# Patient Record
Sex: Male | Born: 1960 | Race: Black or African American | Hispanic: No | Marital: Married | Smoking: Current every day smoker
Health system: Southern US, Community
[De-identification: ages and names within clinical notes are randomized; demographics above are authoritative.]

## PROBLEM LIST (undated history)

## (undated) DIAGNOSIS — Z789 Other specified health status: Secondary | ICD-10-CM

---

## 2004-02-06 ENCOUNTER — Other Ambulatory Visit: Payer: Self-pay

## 2009-09-27 ENCOUNTER — Emergency Department: Payer: Self-pay | Admitting: Emergency Medicine

## 2009-10-20 ENCOUNTER — Ambulatory Visit: Payer: Self-pay

## 2011-06-21 DEATH — deceased

## 2014-01-28 ENCOUNTER — Other Ambulatory Visit: Payer: Self-pay

## 2014-01-28 ENCOUNTER — Telehealth: Payer: Self-pay

## 2014-01-28 DIAGNOSIS — Z1211 Encounter for screening for malignant neoplasm of colon: Secondary | ICD-10-CM

## 2014-01-28 NOTE — Telephone Encounter (Signed)
Gastroenterology Pre-Procedure Review  Request Date: 02/14/2014 Requesting Physician: Dr. Felecia ShellingFanta  PATIENT REVIEW QUESTIONS: The patient responded to the following health history questions as indicated:    1. Diabetes Melitis: no 2. Joint replacements in the past 12 months: no 3. Major health problems in the past 3 months: no 4. Has an artificial valve or MVP: no 5. Has a defibrillator: no 6. Has been advised in past to take antibiotics in advance of a procedure like teeth cleaning: no    MEDICATIONS & ALLERGIES:    Patient reports the following regarding taking any blood thinners:   Plavix? no Aspirin? no Coumadin? no  Patient confirms/reports the following medications:  No current outpatient prescriptions on file.   No current facility-administered medications for this visit.    Patient confirms/reports the following allergies:  No Known Allergies  No orders of the defined types were placed in this encounter.    AUTHORIZATION INFORMATION Primary Insurance:   ID #:  Group #:  Pre-Cert / Auth required:  Pre-Cert / Auth #:   Secondary Insurance:   ID #:   Group #:  Pre-Cert / Auth required: Pre-Cert / Auth #:   SCHEDULE INFORMATION: Procedure has been scheduled as follows:  Date:  02/14/2014                Time: 1:00 PM  Location: [facility] Short Stay   This Gastroenterology Pre-Precedure Review Form is being routed to the following provider(s): R. Roetta SessionsMichael Rourk, MD

## 2014-01-28 NOTE — Telephone Encounter (Signed)
Pt is calling because he received a letter to set up a TCS.

## 2014-01-29 NOTE — Telephone Encounter (Signed)
Appropriate.

## 2014-02-04 MED ORDER — PEG-KCL-NACL-NASULF-NA ASC-C 100 G PO SOLR: 1.0000 | ORAL | Status: DC

## 2014-02-04 NOTE — Telephone Encounter (Signed)
Rx sent to the pharmacy and instructions mailed to pt.  

## 2014-02-07 ENCOUNTER — Telehealth: Payer: Self-pay

## 2014-02-07 NOTE — Telephone Encounter (Signed)
Per Durward Mallardamille, she spoke to [place] and [place] is not [place] for screening colonoscopy. It will be covered at 100%.  #[id number]

## 2014-02-08 ENCOUNTER — Telehealth: Payer: Self-pay

## 2014-02-08 MED ORDER — PEG-KCL-NACL-NASULF-NA ASC-C 100 G PO SOLR: 1.0000 | ORAL | Status: DC

## 2014-02-08 NOTE — Telephone Encounter (Signed)
I tried to call it in again and the transmission failed again.  I called the Movie prep to Snje[place] at [facility].   I called pt and LMOM that prescription has been sent in and to call if he has questions.

## 2014-02-08 NOTE — Telephone Encounter (Signed)
Pt left VM that his prescription for the prep was not at the pharmacy. It does like like the transmission failed. I will resend.

## 2014-02-12 ENCOUNTER — Telehealth: Payer: Self-pay

## 2014-02-12 MED ORDER — PEG 3350-KCL-NA BICARB-NACL 420 G PO SOLR: 4000.0000 mL | ORAL | Status: DC

## 2014-02-12 NOTE — Telephone Encounter (Signed)
Pt called and is scheduled for his colonoscopy on 02/14/2014. He cannot afford the Movie prep. I told him I will send in the Trilyte prep and fax his instructions to the pharmacy to pick up there.  He will call if any problems and he is aware to start his clear liquids today at 2:00 pm.

## 2014-02-12 NOTE — Telephone Encounter (Signed)
Verbal provided by nurse.

## 2014-02-12 NOTE — Telephone Encounter (Signed)
I called phamacy and the transmission had failed for the Trilyte prep. I spoke to [place] who took the verbal order and she had received the instructions for the pt.

## 2014-02-13 ENCOUNTER — Telehealth: Payer: Self-pay | Admitting: *Deleted

## 2014-02-13 NOTE — Telephone Encounter (Signed)
Returned call

## 2014-02-13 NOTE — Telephone Encounter (Signed)
Pt's wife called wanting to speak with [place]. Please advise [phone], wife's name is [employer].

## 2014-02-14 ENCOUNTER — Encounter (HOSPITAL_COMMUNITY)
Admission: RE | Disposition: A | Discharge status: Home / Self Care | Payer: Self-pay | Source: Ambulatory Visit | Attending: Internal Medicine

## 2014-02-14 ENCOUNTER — Ambulatory Visit (HOSPITAL_COMMUNITY)
Admission: RE | Admit: 2014-02-14 | Discharge: 2014-02-14 | Disposition: A | Discharge status: Home / Self Care | Payer: 59 | Source: Ambulatory Visit | Attending: Internal Medicine | Admitting: Internal Medicine

## 2014-02-14 ENCOUNTER — Encounter (HOSPITAL_COMMUNITY): Payer: Self-pay

## 2014-02-14 DIAGNOSIS — K5730 Diverticulosis of large intestine without perforation or abscess without bleeding: Secondary | ICD-10-CM | POA: Insufficient documentation

## 2014-02-14 DIAGNOSIS — F17200 Nicotine dependence, unspecified, uncomplicated: Secondary | ICD-10-CM | POA: Insufficient documentation

## 2014-02-14 DIAGNOSIS — Z1211 Encounter for screening for malignant neoplasm of colon: Secondary | ICD-10-CM | POA: Insufficient documentation

## 2014-02-14 HISTORY — DX: Other specified health status: Z78.9

## 2014-02-14 HISTORY — PX: COLONOSCOPY: SHX5424

## 2014-02-14 SURGERY — COLONOSCOPY
Anesthesia: Moderate Sedation

## 2014-02-14 MED ORDER — MEPERIDINE HCL 100 MG/ML IJ SOLN
INTRAMUSCULAR | Status: AC
  Filled 2014-02-14: qty 2

## 2014-02-14 MED ORDER — STERILE WATER FOR IRRIGATION IR SOLN
Status: DC | PRN
  Administered 2014-02-14: 12:00:00

## 2014-02-14 MED ORDER — MIDAZOLAM HCL 5 MG/5ML IJ SOLN
INTRAMUSCULAR | Status: DC | PRN
  Administered 2014-02-14 (×2): 2 mg via INTRAVENOUS
  Administered 2014-02-14: 1 mg via INTRAVENOUS

## 2014-02-14 MED ORDER — ONDANSETRON HCL 4 MG/2ML IJ SOLN
INTRAMUSCULAR | Status: DC | PRN
  Administered 2014-02-14: 4 mg via INTRAVENOUS

## 2014-02-14 MED ORDER — MEPERIDINE HCL 100 MG/ML IJ SOLN
INTRAMUSCULAR | Status: DC | PRN
  Administered 2014-02-14 (×2): 50 mg via INTRAVENOUS

## 2014-02-14 MED ORDER — ONDANSETRON HCL 4 MG/2ML IJ SOLN
INTRAMUSCULAR | Status: AC
  Filled 2014-02-14: qty 2

## 2014-02-14 MED ORDER — SODIUM CHLORIDE 0.9 % IV SOLN
INTRAVENOUS | Status: DC
Start: 2014-02-14 — End: 2014-02-14

## 2014-02-14 MED ORDER — MIDAZOLAM HCL 5 MG/5ML IJ SOLN
INTRAMUSCULAR | Status: AC
  Filled 2014-02-14: qty 10

## 2014-02-14 NOTE — Op Note (Signed)
[facility] [address] [place], [address]   COLONOSCOPY PROCEDURE REPORT  PATIENT: Nathan Reed, Nathan Reed  MR#:         [id number] BIRTHDATE: 05/10/1961 , 53  yrs. old GENDER: Male ENDOSCOPIST: R.  Roetta Sessions, MD FACP FACG REFERRED BY:  Glenice Laine, M.D. PROCEDURE DATE:  02/14/2014 PROCEDURE:     Screening colonoscopy  INDICATIONS: First-ever average risk colorectal cancer screening examination  INFORMED CONSENT:  The risks, benefits, alternatives and imponderables including but not limited to bleeding, perforation as well as the possibility of a missed lesion have been reviewed.  The potential for biopsy, lesion removal, etc. have also been discussed.  Questions have been answered.  All parties agreeable. Please see the history and physical in the medical record for more information.  MEDICATIONS: Versed 5 mg IV and Demerol 100 mg IV in divided doses.  DESCRIPTION OF PROCEDURE:  After a digital rectal exam was performed, the EC-3890Li ([id number])  colonoscope was advanced from the anus through the rectum and colon to the area of the cecum, ileocecal valve and appendiceal orifice.  The cecum was deeply intubated.  These structures were well-seen and photographed for the record.  From the level of the cecum and ileocecal valve, the scope was slowly and cautiously withdrawn.  The mucosal surfaces were carefully surveyed utilizing scope tip deflection to facilitate fold flattening as needed.  The scope was pulled down into the rectum where a thorough examination including retroflexion was performed.    FINDINGS:  Adequate preparation. Normal rectum. Few scattered left-sided diverticula; the remainder of the colonic mucosa appeared normal.  THERAPEUTIC / DIAGNOSTIC MANEUVERS PERFORMED:  None  COMPLICATIONS: None  CECAL WITHDRAWAL TIME:  8 minutes  IMPRESSION:  Colonic diverticulosis (mild)  RECOMMENDATIONS: Repeat colonoscopy in 10 years for  screening purposes.   _______________________________ eSigned:  R. Roetta Sessions, MD FACP [facility] 02/14/2014 12:32 PM   CC:

## 2014-02-14 NOTE — H&P (Signed)
@  QZES@   Primary Care Physician:  Rosita Fire, MD Primary Gastroenterologist:  Dr. Gala Romney  Pre-Procedure History & Physical: HPI:  Nathan Reed is a 53 y.o. male is here for a screening colonoscopy. No bowel symptoms. No family history of colon cancer. No prior colonoscopy.  Past Medical History  Diagnosis Date  . Medical history non-contributory     History reviewed. No pertinent past surgical history.  Prior to Admission medications   Medication Sig Start Date End Date Taking? Authorizing Provider  peg 3350 powder (MOVIPREP) 100 G SOLR Take 1 kit (200 g total) by mouth as directed. 02/08/14  Yes Daneil Dolin, MD  polyethylene glycol-electrolytes (TRILYTE) 420 G solution Take 4,000 mLs by mouth as directed. 02/12/14  Yes Daneil Dolin, MD    Allergies as of 01/28/2014  . (No Known Allergies)    History reviewed. No pertinent family history.  History   Social History  . Marital Status: Married    Spouse Name: N/A    Number of Children: N/A  . Years of Education: N/A   Occupational History  . Not on file.   Social History Main Topics  . Smoking status: Current Every Day Smoker  . Smokeless tobacco: Not on file  . Alcohol Use: 0.6 oz/week    1 Cans of beer per week  . Drug Use: No  . Sexual Activity: Not on file   Other Topics Concern  . Not on file   Social History Narrative  . No narrative on file    Review of Systems: See HPI, otherwise negative ROS  Physical Exam: BP 141/89  Temp(Src) 98.2 F (36.8 C) (Oral)  Resp 16  Ht $R'5\' 7"'wV$  (1.702 m)  Wt 160 lb (72.576 kg)  BMI 25.05 kg/m2  SpO2 98% General:   Alert,  Well-developed, well-nourished, pleasant and cooperative in NAD Head:  Normocephalic and atraumatic. Eyes:  Sclera clear, no icterus.   Conjunctiva pink. Ears:  Normal auditory acuity. Nose:  No deformity, discharge,  or lesions. Mouth:  No deformity or lesions, dentition normal. Neck:  Supple; no masses or thyromegaly. Lungs:  Clear  throughout to auscultation.   No wheezes, crackles, or rhonchi. No acute distress. Heart:  Regular rate and rhythm; no murmurs, clicks, rubs,  or gallops. Abdomen:  Soft, nontender and nondistended. No masses, hepatosplenomegaly or hernias noted. Normal bowel sounds, without guarding, and without rebound.   Msk:  Symmetrical without gross deformities. Normal posture. Pulses:  Normal pulses noted. Extremities:  Without clubbing or edema. Neurologic:  Alert and  oriented x4;  grossly normal neurologically. Skin:  Intact without significant lesions or rashes. Cervical Nodes:  No significant cervical adenopathy. Psych:  Alert and cooperative. Normal mood and affect.  Impression/Plan: Nathan Reed is now here to undergo a screening colonoscopy.  First-ever average risk screening examination.  Risks, benefits, limitations, imponderables and alternatives regarding colonoscopy have been reviewed with the patient. Questions have been answered. All parties agreeable.     Notice:  This dictation was prepared with Dragon dictation along with smaller phrase technology. Any transcriptional errors that result from this process are unintentional and may not be corrected upon review.

## 2014-02-14 NOTE — Discharge Instructions (Addendum)
Colonoscopy Discharge Instructions  Read the instructions outlined below and refer to this sheet in the next few weeks. These discharge instructions provide you with general information on caring for yourself after you leave the hospital. Your doctor may also give you specific instructions. While your treatment has been planned according to the most current medical practices available, unavoidable complications occasionally occur. If you have any problems or questions after discharge, call Dr. Jena Gaussourk at [phone]. ACTIVITY  You may resume your regular activity, but move at a slower pace for the next 24 hours.   Take frequent rest periods for the next 24 hours.   Walking will help get rid of the air and reduce the bloated feeling in your belly (abdomen).   No driving for 24 hours (because of the medicine (anesthesia) used during the test).    Do not sign any important legal documents or operate any machinery for 24 hours (because of the anesthesia used during the test).  NUTRITION  Drink plenty of fluids.   You may resume your normal diet as instructed by your doctor.   Begin with a light meal and progress to your normal diet. Heavy or fried foods are harder to digest and may make you feel sick to your stomach (nauseated).   Avoid alcoholic beverages for 24 hours or as instructed.  MEDICATIONS  You may resume your normal medications unless your doctor tells you otherwise.  WHAT YOU CAN EXPECT TODAY  Some feelings of bloating in the abdomen.   Passage of more gas than usual.   Spotting of blood in your stool or on the toilet paper.  IF YOU HAD POLYPS REMOVED DURING THE COLONOSCOPY:  No aspirin products for 7 days or as instructed.   No alcohol for 7 days or as instructed.   Eat a soft diet for the next 24 hours.  FINDING OUT THE RESULTS OF YOUR TEST Not all test results are available during your visit. If your test results are not back during the visit, make an appointment  with your caregiver to find out the results. Do not assume everything is normal if you have not heard from your caregiver or the medical facility. It is important for you to follow up on all of your test results.  SEEK IMMEDIATE MEDICAL ATTENTION IF:  You have more than a spotting of blood in your stool.   Your belly is swollen (abdominal distention).   You are nauseated or vomiting.   You have a temperature over 101.   You have abdominal pain or discomfort that is severe or gets worse throughout the day.     Diverticulosis information provided  Repeat colonoscopy in 10 years for screening purposes  High-Fiber Diet Fiber is found in fruits, vegetables, and grains. A high-fiber diet encourages the addition of more whole grains, legumes, fruits, and vegetables in your diet. The recommended amount of fiber for adult males is 38 g per day. For adult females, it is 25 g per day. Pregnant and lactating women should get 28 g of fiber per day. If you have a digestive or bowel problem, ask your caregiver for advice before adding high-fiber foods to your diet. Eat a variety of high-fiber foods instead of only a select few type of foods.  PURPOSE  To increase stool bulk.  To make bowel movements more regular to prevent constipation.  To lower cholesterol.  To prevent overeating. WHEN IS THIS DIET USED?  It may be used if you have constipation and  hemorrhoids.  It may be used if you have uncomplicated diverticulosis (intestine condition) and irritable bowel syndrome.  It may be used if you need help with weight management.  It may be used if you want to add it to your diet as a protective measure against atherosclerosis, diabetes, and cancer. SOURCES OF FIBER  Whole-grain breads and cereals.  Fruits, such as apples, oranges, bananas, berries, prunes, and pears.  Vegetables, such as green peas, carrots, sweet potatoes, beets, broccoli, cabbage, spinach, and artichokes.  Legumes,  such split peas, soy, lentils.  Almonds. FIBER CONTENT IN FOODS Starches and Grains / Dietary Fiber (g)  Cheerios, 1 cup / 3 g  Corn Flakes cereal, 1 cup / 0.7 g  Rice crispy treat cereal, 1 cup / 0.3 g  Instant oatmeal (cooked),  cup / 2 g  Frosted wheat cereal, 1 cup / 5.1 g  Brown, long-grain rice (cooked), 1 cup / 3.5 g  White, long-grain rice (cooked), 1 cup / 0.6 g  Enriched macaroni (cooked), 1 cup / 2.5 g Legumes / Dietary Fiber (g)  Baked beans (canned, plain, or vegetarian),  cup / 5.2 g  Kidney beans (canned),  cup / 6.8 g  Pinto beans (cooked),  cup / 5.5 g Breads and Crackers / Dietary Fiber (g)  Plain or honey graham crackers, 2 squares / 0.7 g  Saltine crackers, 3 squares / 0.3 g  Plain, salted pretzels, 10 pieces / 1.8 g  Whole-wheat bread, 1 slice / 1.9 g  White bread, 1 slice / 0.7 g  Raisin bread, 1 slice / 1.2 g  Plain bagel, 3 oz / 2 g  Flour tortilla, 1 oz / 0.9 g  Corn tortilla, 1 small / 1.5 g  Hamburger or hotdog bun, 1 small / 0.9 g Fruits / Dietary Fiber (g)  Apple with skin, 1 medium / 4.4 g  Sweetened applesauce,  cup / 1.5 g  Banana,  medium / 1.5 g  Grapes, 10 grapes / 0.4 g  Orange, 1 small / 2.3 g  Raisin, 1.5 oz / 1.6 g  Melon, 1 cup / 1.4 g Vegetables / Dietary Fiber (g)  Green beans (canned),  cup / 1.3 g  Carrots (cooked),  cup / 2.3 g  Broccoli (cooked),  cup / 2.8 g  Peas (cooked),  cup / 4.4 g  Mashed potatoes,  cup / 1.6 g  Lettuce, 1 cup / 0.5 g  Corn (canned),  cup / 1.6 g  Tomato,  cup / 1.1 g Document Released: 09/06/2005 Document Revised: 03/07/2012 Document Reviewed: 12/09/2011 [facility] Patient Information 2014 [place], [place]. Diverticulosis Diverticulosis is a common condition that develops when small pouches (diverticula) form in the wall of the colon. The risk of diverticulosis increases with age. It happens more often in people who eat a low-fiber diet. Most  individuals with diverticulosis have no symptoms. Those individuals with symptoms usually experience abdominal pain, constipation, or loose stools (diarrhea). HOME CARE INSTRUCTIONS   Increase the amount of fiber in your diet as directed by your caregiver or dietician. This may reduce symptoms of diverticulosis.  Your caregiver may recommend taking a dietary fiber supplement.  Drink at least 6 to 8 glasses of water each day to prevent constipation.  Try not to strain when you have a bowel movement.  Your caregiver may recommend avoiding nuts and seeds to prevent complications, although this is still an uncertain benefit.  Only take over-the-counter or prescription medicines for pain, discomfort, or fever as  directed by your caregiver. FOODS WITH HIGH FIBER CONTENT INCLUDE:  Fruits. Apple, peach, pear, tangerine, raisins, prunes.  Vegetables. Brussels sprouts, asparagus, broccoli, cabbage, carrot, cauliflower, romaine lettuce, spinach, summer squash, tomato, winter squash, zucchini.  Starchy Vegetables. Baked beans, kidney beans, lima beans, split peas, lentils, potatoes (with skin).  Grains. Whole wheat bread, brown rice, bran flake cereal, plain oatmeal, white rice, shredded wheat, bran muffins. SEEK IMMEDIATE MEDICAL CARE IF:   You develop increasing pain or severe bloating.  You have an oral temperature above 102 F (38.9 C), not controlled by medicine.  You develop vomiting or bowel movements that are bloody or black. Document Released: 06/03/2004 Document Revised: 11/29/2011 Document Reviewed: 02/04/2010 [facility] Patient Information 2014 [place], [place].

## 2014-02-15 ENCOUNTER — Encounter (HOSPITAL_COMMUNITY): Payer: Self-pay | Admitting: Internal Medicine

## 2016-03-15 ENCOUNTER — Emergency Department (HOSPITAL_BASED_OUTPATIENT_CLINIC_OR_DEPARTMENT_OTHER): Payer: Self-pay

## 2016-03-15 ENCOUNTER — Emergency Department (HOSPITAL_BASED_OUTPATIENT_CLINIC_OR_DEPARTMENT_OTHER)
Admission: EM | Admit: 2016-03-15 | Discharge: 2016-03-15 | Disposition: A | Discharge status: Home / Self Care | Payer: Self-pay | Attending: Emergency Medicine | Admitting: Emergency Medicine

## 2016-03-15 ENCOUNTER — Encounter (HOSPITAL_BASED_OUTPATIENT_CLINIC_OR_DEPARTMENT_OTHER): Payer: Self-pay

## 2016-03-15 DIAGNOSIS — Y999 Unspecified external cause status: Secondary | ICD-10-CM | POA: Insufficient documentation

## 2016-03-15 DIAGNOSIS — R55 Syncope and collapse: Secondary | ICD-10-CM | POA: Insufficient documentation

## 2016-03-15 DIAGNOSIS — S0081XA Abrasion of other part of head, initial encounter: Secondary | ICD-10-CM | POA: Insufficient documentation

## 2016-03-15 DIAGNOSIS — Y9389 Activity, other specified: Secondary | ICD-10-CM | POA: Insufficient documentation

## 2016-03-15 DIAGNOSIS — F17200 Nicotine dependence, unspecified, uncomplicated: Secondary | ICD-10-CM | POA: Insufficient documentation

## 2016-03-15 DIAGNOSIS — M791 Myalgia: Secondary | ICD-10-CM | POA: Insufficient documentation

## 2016-03-15 DIAGNOSIS — R51 Headache: Secondary | ICD-10-CM | POA: Insufficient documentation

## 2016-03-15 DIAGNOSIS — R319 Hematuria, unspecified: Secondary | ICD-10-CM | POA: Insufficient documentation

## 2016-03-15 DIAGNOSIS — R109 Unspecified abdominal pain: Secondary | ICD-10-CM

## 2016-03-15 DIAGNOSIS — Y92410 Unspecified street and highway as the place of occurrence of the external cause: Secondary | ICD-10-CM | POA: Insufficient documentation

## 2016-03-15 LAB — URINALYSIS, ROUTINE W REFLEX MICROSCOPIC
GLUCOSE, UA: NEGATIVE mg/dL
Ketones, ur: 15 mg/dL — AB
Leukocytes, UA: NEGATIVE
Nitrite: NEGATIVE
Protein, ur: NEGATIVE mg/dL
Specific Gravity, Urine: 1.031 — ABNORMAL HIGH (ref 1.005–1.030)
pH: 5 (ref 5.0–8.0)

## 2016-03-15 LAB — COMPREHENSIVE METABOLIC PANEL
ALT: 27 U/L (ref 17–63)
AST: 55 U/L — AB (ref 15–41)
Albumin: 4.4 g/dL (ref 3.5–5.0)
Alkaline Phosphatase: 56 U/L (ref 38–126)
Anion gap: 7 (ref 5–15)
BILIRUBIN TOTAL: 1 mg/dL (ref 0.3–1.2)
BUN: 15 mg/dL (ref 6–20)
CHLORIDE: 106 mmol/L (ref 101–111)
CO2: 26 mmol/L (ref 22–32)
CREATININE: 0.94 mg/dL (ref 0.61–1.24)
Calcium: 9.5 mg/dL (ref 8.9–10.3)
GFR calc Af Amer: >60 mL/min (ref >60)
GFR calc non Af Amer: >60 mL/min (ref >60)
Glucose, Bld: 107 mg/dL — ABNORMAL HIGH (ref 65–99)
Potassium: 3.7 mmol/L (ref 3.5–5.1)
Sodium: 139 mmol/L (ref 135–145)
TOTAL PROTEIN: 7.7 g/dL (ref 6.5–8.1)

## 2016-03-15 LAB — URINE MICROSCOPIC-ADD ON: BACTERIA UA: NONE SEEN

## 2016-03-15 LAB — CBC WITH DIFFERENTIAL/PLATELET
Basophils Absolute: 0 10*3/uL (ref 0.0–0.1)
Basophils Relative: 0 %
Eosinophils Absolute: 0.3 10*3/uL (ref 0.0–0.7)
Eosinophils Relative: 2 %
HEMATOCRIT: 39.9 % (ref 39.0–52.0)
HEMOGLOBIN: 14 g/dL (ref 13.0–17.0)
LYMPHS PCT: 19 %
Lymphs Abs: 2.3 10*3/uL (ref 0.7–4.0)
MCH: 33.3 pg (ref 26.0–34.0)
MCHC: 35.1 g/dL (ref 30.0–36.0)
MCV: 95 fL (ref 78.0–100.0)
Monocytes Absolute: 1.2 10*3/uL — ABNORMAL HIGH (ref 0.1–1.0)
Monocytes Relative: 10 %
NEUTROS ABS: 8 10*3/uL — AB (ref 1.7–7.7)
Neutrophils Relative %: 69 %
Platelets: 261 10*3/uL (ref 150–400)
RBC: 4.2 MIL/uL — ABNORMAL LOW (ref 4.22–5.81)
RDW: 13 % (ref 11.5–15.5)
WBC: 11.9 10*3/uL — ABNORMAL HIGH (ref 4.0–10.5)

## 2016-03-15 LAB — LIPASE, BLOOD: Lipase: 18 U/L (ref 11–51)

## 2016-03-15 MED ORDER — NAPROXEN 500 MG PO TABS: 500.0000 mg | ORAL_TABLET | Freq: Two times a day (BID) | ORAL | Status: AC

## 2016-03-15 MED ORDER — IOPAMIDOL (ISOVUE-300) INJECTION 61%
100.0000 mL | Freq: Once | INTRAVENOUS | Status: AC | PRN
  Administered 2016-03-15: 100 mL via INTRAVENOUS

## 2016-03-15 MED ORDER — CYCLOBENZAPRINE HCL 10 MG PO TABS: 10.0000 mg | ORAL_TABLET | Freq: Two times a day (BID) | ORAL | Status: AC | PRN

## 2016-03-15 MED ORDER — SODIUM CHLORIDE 0.9 % IV BOLUS (SEPSIS)
1000.0000 mL | Freq: Once | INTRAVENOUS | Status: AC
  Administered 2016-03-15: 1000 mL via INTRAVENOUS
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
## 2016-03-15 NOTE — ED Notes (Signed)
Pt reports naproxen doesn't work for him, MD states while in room she was not willing to give him anything stronger and to follow up with his primary for cont pain  control

## 2016-03-15 NOTE — ED Notes (Signed)
MD at bedside. 
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
## 2016-03-15 NOTE — ED Notes (Signed)
Patient transported to CT 

## 2016-03-23 ENCOUNTER — Encounter (HOSPITAL_BASED_OUTPATIENT_CLINIC_OR_DEPARTMENT_OTHER): Payer: Self-pay | Admitting: *Deleted

## 2016-03-23 ENCOUNTER — Emergency Department (HOSPITAL_BASED_OUTPATIENT_CLINIC_OR_DEPARTMENT_OTHER)
Admission: EM | Admit: 2016-03-23 | Discharge: 2016-03-23 | Disposition: A | Discharge status: Home / Self Care | Payer: Self-pay | Attending: Emergency Medicine | Admitting: Emergency Medicine

## 2016-03-23 DIAGNOSIS — F10920 Alcohol use, unspecified with intoxication, uncomplicated: Secondary | ICD-10-CM

## 2016-03-23 DIAGNOSIS — S61411A Laceration without foreign body of right hand, initial encounter: Secondary | ICD-10-CM | POA: Insufficient documentation

## 2016-03-23 DIAGNOSIS — F17200 Nicotine dependence, unspecified, uncomplicated: Secondary | ICD-10-CM | POA: Insufficient documentation

## 2016-03-23 DIAGNOSIS — W25XXXA Contact with sharp glass, initial encounter: Secondary | ICD-10-CM | POA: Insufficient documentation

## 2016-03-23 DIAGNOSIS — Y939 Activity, unspecified: Secondary | ICD-10-CM | POA: Insufficient documentation

## 2016-03-23 DIAGNOSIS — Y999 Unspecified external cause status: Secondary | ICD-10-CM | POA: Insufficient documentation

## 2016-03-23 DIAGNOSIS — F10120 Alcohol abuse with intoxication, uncomplicated: Secondary | ICD-10-CM | POA: Insufficient documentation

## 2016-03-23 DIAGNOSIS — Y929 Unspecified place or not applicable: Secondary | ICD-10-CM | POA: Insufficient documentation

## 2016-03-23 MED ORDER — LIDOCAINE HCL (PF) 1 % IJ SOLN
5.0000 mL | Freq: Once | INTRAMUSCULAR | Status: AC
  Administered 2016-03-23: 5 mL via INTRADERMAL
  Filled 2016-03-23: qty 5

## 2016-03-23 MED ORDER — TETANUS-DIPHTH-ACELL PERTUSSIS 5-2.5-18.5 LF-MCG/0.5 IM SUSP
INTRAMUSCULAR | Status: AC
  Filled 2016-03-23: qty 0.5

## 2016-03-23 MED ORDER — TETANUS-DIPHTH-ACELL PERTUSSIS 5-2.5-18.5 LF-MCG/0.5 IM SUSP
0.5000 mL | Freq: Once | INTRAMUSCULAR | Status: AC
  Administered 2016-03-23: 0.5 mL via INTRAMUSCULAR

## 2016-03-23 MED ORDER — TETANUS-DIPHTH-ACELL PERTUSSIS 5-2.5-18.5 LF-MCG/0.5 IM SUSP: 0.5000 mL | Freq: Once | INTRAMUSCULAR | Status: DC

## 2016-03-23 MED ORDER — BACITRACIN ZINC 500 UNIT/GM EX OINT
TOPICAL_OINTMENT | Freq: Once | CUTANEOUS | Status: AC
  Administered 2016-03-23: 1 via TOPICAL
  Filled 2016-03-23: qty 28.35

## 2016-03-23 NOTE — Discharge Instructions (Signed)
Laceration Care, Adult °A laceration is a cut that goes through all of the layers of the skin and into the tissue that is right under the skin. Some lacerations heal on their own. Others need to be closed with stitches (sutures), staples, skin adhesive strips, or skin glue. Proper laceration care minimizes the risk of infection and helps the laceration to heal better. °HOW TO CARE FOR YOUR LACERATION °If sutures or staples were used: °· Keep the wound clean and dry. °· If you were given a bandage (dressing), you should change it at least one time per day or as told by your health care provider. You should also change it if it becomes wet or dirty. °· Keep the wound completely dry for the first 24 hours or as told by your health care provider. After that time, you may shower or bathe. However, make sure that the wound is not soaked in water until after the sutures or staples have been removed. °· Clean the wound one time each day or as told by your health care provider: °· Wash the wound with soap and water. °· Rinse the wound with water to remove all soap. °· Pat the wound dry with a clean towel. Do not rub the wound. °· After cleaning the wound, apply a thin layer of antibiotic ointment as told by your health care provider. This will help to prevent infection and keep the dressing from sticking to the wound. °· Have the sutures or staples removed as told by your health care provider. °If skin adhesive strips were used: °· Keep the wound clean and dry. °· If you were given a bandage (dressing), you should change it at least one time per day or as told by your health care provider. You should also change it if it becomes dirty or wet. °· Do not get the skin adhesive strips wet. You may shower or bathe, but be careful to keep the wound dry. °· If the wound gets wet, pat it dry with a clean towel. Do not rub the wound. °· Skin adhesive strips fall off on their own. You may trim the strips as the wound heals. Do not  remove skin adhesive strips that are still stuck to the wound. They will fall off in time. °If skin glue was used: °· Try to keep the wound dry, but you may briefly wet it in the shower or bath. Do not soak the wound in water, such as by swimming. °· After you have showered or bathed, gently pat the wound dry with a clean towel. Do not rub the wound. °· Do not do any activities that will make you sweat heavily until the skin glue has fallen off on its own. °· Do not apply liquid, cream, or ointment medicine to the wound while the skin glue is in place. Using those may loosen the film before the wound has healed. °· If you were given a bandage (dressing), you should change it at least one time per day or as told by your health care provider. You should also change it if it becomes dirty or wet. °· If a dressing is placed over the wound, be careful not to apply tape directly over the skin glue. Doing that may cause the glue to be pulled off before the wound has healed. °· Do not pick at the glue. The skin glue usually remains in place for 5-10 days, then it falls off of the skin. °General Instructions °· Take over-the-counter and prescription   medicines only as told by your health care provider.  If you were prescribed an antibiotic medicine or ointment, take or apply it as told by your doctor. Do not stop using it even if your condition improves.  To help prevent scarring, make sure to cover your wound with sunscreen whenever you are outside after stitches are removed, after adhesive strips are removed, or when glue remains in place and the wound is healed. Make sure to wear a sunscreen of at least 30 SPF.  Do not scratch or pick at the wound.  Keep all follow-up visits as told by your health care provider. This is important.  Check your wound every day for signs of infection. Watch for:  Redness, swelling, or pain.  Fluid, blood, or pus.  Raise (elevate) the injured area above the level of your heart  while you are sitting or lying down, if possible. SEEK MEDICAL CARE IF:  You received a tetanus shot and you have swelling, severe pain, redness, or bleeding at the injection site.  You have a fever.  A wound that was closed breaks open.  You notice a bad smell coming from your wound or your dressing.  You notice something coming out of the wound, such as wood or glass.  Your pain is not controlled with medicine.  You have increased redness, swelling, or pain at the site of your wound.  You have fluid, blood, or pus coming from your wound.  You notice a change in the color of your skin near your wound.  You need to change the dressing frequently due to fluid, blood, or pus draining from the wound.  You develop a new rash.  You develop numbness around the wound. SEEK IMMEDIATE MEDICAL CARE IF:  You develop severe swelling around the wound.  Your pain suddenly increases and is severe.  You develop painful lumps near the wound or on skin that is anywhere on your body.  You have a red streak going away from your wound.  The wound is on your hand or foot and you cannot properly move a finger or toe.  The wound is on your hand or foot and you notice that your fingers or toes look pale or bluish.   This information is not intended to replace advice given to you by your health care provider. Make sure you discuss any questions you have with your health care provider.   Document Released: 09/06/2005 Document Revised: 01/21/2015 Document Reviewed: 09/02/2014 Elsevier Interactive Patient Education 2016 [employer]   Alcohol Intoxication Alcohol intoxication occurs when the amount of alcohol that a person has consumed impairs his or her ability to mentally and physically function. Alcohol directly impairs the normal chemical activity of the brain. Drinking large amounts of alcohol can lead to changes in mental function and behavior, and it can cause many physical effects that  can be harmful.  Alcohol intoxication can range in severity from mild to very severe. Various factors can affect the level of intoxication that occurs, such as the person's age, gender, weight, frequency of alcohol consumption, and the presence of other medical conditions (such as diabetes, seizures, or heart conditions). Dangerous levels of alcohol intoxication may occur when people drink large amounts of alcohol in a short period (binge drinking). Alcohol can also be especially dangerous when combined with certain prescription medicines or "recreational" drugs. SIGNS AND SYMPTOMS Some common signs and symptoms of mild alcohol intoxication include:  Loss of coordination.  Changes in mood and behavior.  Impaired judgment.  Slurred speech. As alcohol intoxication progresses to more severe levels, other signs and symptoms will appear. These may include:  Vomiting.  Confusion and impaired memory.  Slowed breathing.  Seizures.  Loss of consciousness. DIAGNOSIS  Your health care provider will take a medical history and perform a physical exam. You will be asked about the amount and type of alcohol you have consumed. Blood tests will be done to measure the concentration of alcohol in your blood. In many places, your blood alcohol level must be lower than 80 mg/dL (6.96%0.08%) to legally drive. However, many dangerous effects of alcohol can occur at much lower levels.  TREATMENT  People with alcohol intoxication often do not require treatment. Most of the effects of alcohol intoxication are temporary, and they go away as the alcohol naturally leaves the body. Your health care provider will monitor your condition until you are stable enough to go home. Fluids are sometimes given through an IV access tube to help prevent dehydration.  HOME CARE INSTRUCTIONS  Do not drive after drinking alcohol.  Stay hydrated. Drink enough water and fluids to keep your urine clear or pale yellow. Avoid caffeine.    Only take over-the-counter or prescription medicines as directed by your health care provider.  SEEK MEDICAL CARE IF:   You have persistent vomiting.   You do not feel better after a few days.  You have frequent alcohol intoxication. Your health care provider can help determine if you should see a substance use treatment counselor. SEEK IMMEDIATE MEDICAL CARE IF:   You become shaky or tremble when you try to stop drinking.   You shake uncontrollably (seizure).   You throw up (vomit) blood. This may be bright red or may look like black coffee grounds.   You have blood in your stool. This may be bright red or may appear as a black, tarry, bad smelling stool.   You become lightheaded or faint.  MAKE SURE YOU:   Understand these instructions.  Will watch your condition.  Will get help right away if you are not doing well or get worse.   This information is not intended to replace advice given to you by your health care provider. Make sure you discuss any questions you have with your health care provider.   Document Released: 06/16/2005 Document Revised: 05/09/2013 Document Reviewed: 02/09/2013 Elsevier Interactive Patient Education [employer]

## 2016-03-23 NOTE — ED Notes (Signed)
Lac to rt hand w glass  Base of ring finger rt hand  Bleeding controlled
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
